# Patient Record
Sex: Male | Born: 1940 | Race: Black or African American | Hispanic: No | Marital: Single
Health system: Southern US, Community
[De-identification: ages and names within clinical notes are randomized; demographics above are authoritative.]

---

## 2014-07-25 ENCOUNTER — Other Ambulatory Visit: Payer: Self-pay | Admitting: Specialist

## 2014-07-25 DIAGNOSIS — M5416 Radiculopathy, lumbar region: Secondary | ICD-10-CM

## 2014-08-22 ENCOUNTER — Other Ambulatory Visit: Payer: Self-pay

## 2014-09-11 ENCOUNTER — Other Ambulatory Visit: Payer: Self-pay | Admitting: Specialist

## 2014-09-11 ENCOUNTER — Ambulatory Visit
Admission: RE | Admit: 2014-09-11 | Discharge: 2014-09-11 | Disposition: A | Discharge status: Home / Self Care | Payer: Medicare Other | Source: Ambulatory Visit | Attending: Specialist | Admitting: Specialist

## 2014-09-11 DIAGNOSIS — M5416 Radiculopathy, lumbar region: Secondary | ICD-10-CM

## 2014-09-11 DIAGNOSIS — T1590XA Foreign body on external eye, part unspecified, unspecified eye, initial encounter: Secondary | ICD-10-CM

## 2014-10-01 ENCOUNTER — Ambulatory Visit
Admission: RE | Admit: 2014-10-01 | Discharge: 2014-10-01 | Disposition: A | Discharge status: Home / Self Care | Payer: Medicare Other | Source: Ambulatory Visit | Attending: Specialist | Admitting: Specialist

## 2014-11-21 ENCOUNTER — Other Ambulatory Visit: Payer: Self-pay | Admitting: Neurosurgery

## 2014-11-21 DIAGNOSIS — M419 Scoliosis, unspecified: Secondary | ICD-10-CM

## 2014-12-26 ENCOUNTER — Other Ambulatory Visit: Payer: Medicare Other

## 2015-01-07 ENCOUNTER — Inpatient Hospital Stay
Admission: RE | Admit: 2015-01-07 | Discharge: 2015-01-07 | Disposition: A | Discharge status: Home / Self Care | Payer: Medicare Other | Source: Ambulatory Visit | Attending: Neurosurgery | Admitting: Neurosurgery

## 2015-01-07 ENCOUNTER — Inpatient Hospital Stay: Admission: RE | Admit: 2015-01-07 | Payer: Medicare Other | Source: Ambulatory Visit

## 2015-01-07 NOTE — Discharge Instructions (Signed)
Myelogram Discharge Instructions  1. Go home and rest quietly for the next 24 hours.  It is important to lie flat for the next 24 hours.  Get up only to go to the restroom.  You may lie in the bed or on a couch on your back, your stomach, your left side or your right side.  You may have one pillow under your head.  You may have pillows between your knees while you are on your side or under your knees while you are on your back.  2. DO NOT drive today.  Recline the seat as far back as it will go, while still wearing your seat belt, on the way home.  3. You may get up to go to the bathroom as needed.  You may sit up for 10 minutes to eat.  You may resume your normal diet and medications unless otherwise indicated.  Drink plenty of extra fluids today and tomorrow.  4. The incidence of a spinal headache with nausea and/or vomiting is about 5% (one in 20 patients).  If you develop a headache, lie flat and drink plenty of fluids until the headache goes away.  Caffeinated beverages may be helpful.  If you develop severe nausea and vomiting or a headache that does not go away with flat bed rest, call [phone].  5. You may resume normal activities after your 24 hours of bed rest is over; however, do not exert yourself strongly or do any heavy lifting tomorrow.  6. Call your physician for a follow-up appointment.   You may resume Xarelto today.  You may resume Bupropion, Effexor and Mirtazapine on Thursday, January 08, 2015 after 1:00p.m.

## 2015-01-21 ENCOUNTER — Other Ambulatory Visit: Payer: Self-pay | Admitting: Neurosurgery

## 2015-01-21 DIAGNOSIS — M419 Scoliosis, unspecified: Secondary | ICD-10-CM

## 2015-02-03 ENCOUNTER — Ambulatory Visit
Admission: RE | Admit: 2015-02-03 | Discharge: 2015-02-03 | Disposition: A | Discharge status: Home / Self Care | Payer: Medicare Other | Source: Ambulatory Visit | Attending: Neurosurgery | Admitting: Neurosurgery

## 2015-02-03 DIAGNOSIS — M419 Scoliosis, unspecified: Secondary | ICD-10-CM

## 2015-02-03 MED ORDER — IOHEXOL 180 MG/ML  SOLN
15.0000 mL | Freq: Once | INTRAMUSCULAR | Status: DC | PRN
  Administered 2015-02-03: 15 mL via INTRATHECAL

## 2015-02-03 MED ORDER — ONDANSETRON HCL 4 MG/2ML IJ SOLN
4.0000 mg | Freq: Once | INTRAMUSCULAR | Status: AC
  Administered 2015-02-03: 4 mg via INTRAMUSCULAR

## 2015-02-03 MED ORDER — MEPERIDINE HCL 100 MG/ML IJ SOLN
75.0000 mg | Freq: Once | INTRAMUSCULAR | Status: AC
  Administered 2015-02-03: 50 mg via INTRAMUSCULAR

## 2015-02-03 NOTE — Discharge Instructions (Signed)
Myelogram Discharge Instructions  1. Go home and rest quietly for the next 24 hours.  It is important to lie flat for the next 24 hours.  Get up only to go to the restroom.  You may lie in the bed or on a couch on your back, your stomach, your left side or your right side.  You may have one pillow under your head.  You may have pillows between your knees while you are on your side or under your knees while you are on your back.  2. DO NOT drive today.  Recline the seat as far back as it will go, while still wearing your seat belt, on the way home.  3. You may get up to go to the bathroom as needed.  You may sit up for 10 minutes to eat.  You may resume your normal diet and medications unless otherwise indicated.  Drink lots of extra fluids today and tomorrow.  4. The incidence of headache, nausea, or vomiting is about 5% (one in 20 patients).  If you develop a headache, lie flat and drink plenty of fluids until the headache goes away.  Caffeinated beverages may be helpful.  If you develop severe nausea and vomiting or a headache that does not go away with flat bed rest, call [phone].  5. You may resume normal activities after your 24 hours of bed rest is over; however, do not exert yourself strongly or do any heavy lifting tomorrow. If when you get up you have a headache when standing, go back to bed and force fluids for another 24 hours.  6. Call your physician for a follow-up appointment.  The results of your myelogram will be sent directly to your physician by the following day.  7. If you have any questions or if complications develop after you arrive home, please call [phone].  Discharge instructions have been explained to the patient.  The patient, or the person responsible for the patient, fully understands these instructions.       May resume Xeralto today.   May resume Mirtazapine, Bupropion and Effexor.

## 2015-02-03 NOTE — Progress Notes (Addendum)
Patient states he has been off Effexor, Bupropion and Mirtazapine for at least the past two days, and that he has been off Xarelto for the past 24 hours.

## 2016-01-03 IMAGING — MR MR LUMBAR SPINE W/O CM
5 series · 32 of 48 positions shown · non-contrast
Comparison: None.

CLINICAL DATA: Lumbar radiculopathy. Chronic lumbar spine pain.
Bilateral arm leg pain for over 20 years. Symptoms have recently
become worse.

EXAM:
MRI LUMBAR SPINE WITHOUT CONTRAST
TECHNIQUE: Multiplanar, multisequence MR imaging of the lumbar spine was
performed. No intravenous contrast was administered.

[Series 4: T2 · sagittal · 4.0mm · 0.49mm/px · 6 of 15 slices shown (1 of 2)]
[im 1/15]
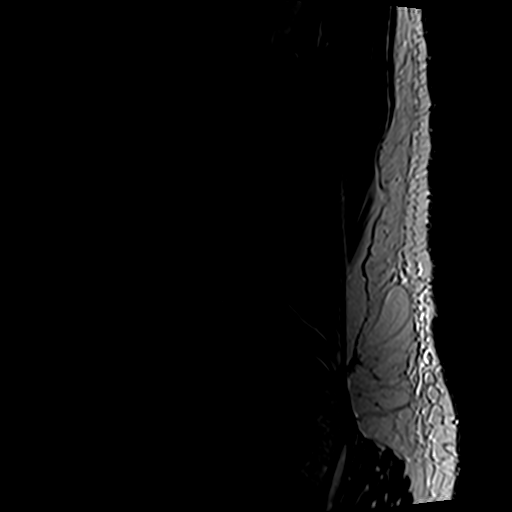
[im 3/15]
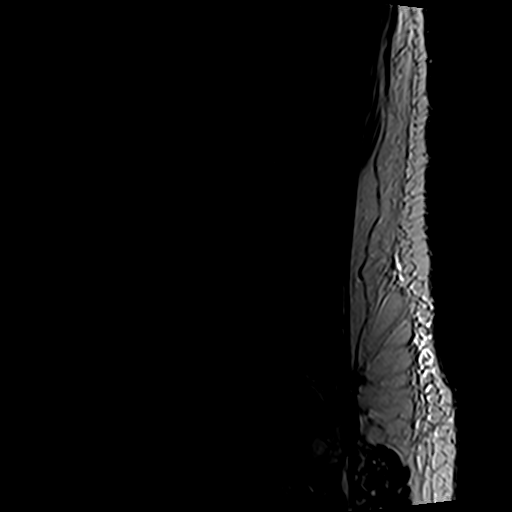
[im 6/15]
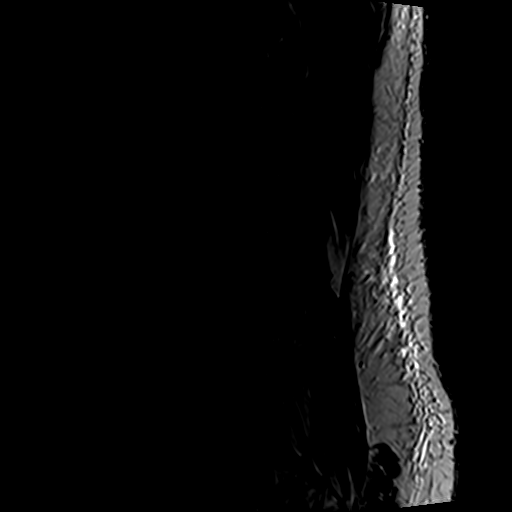
[im 9/15]
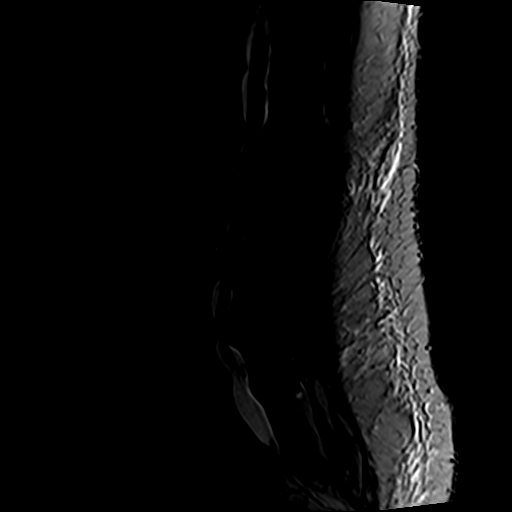
[im 12/15]
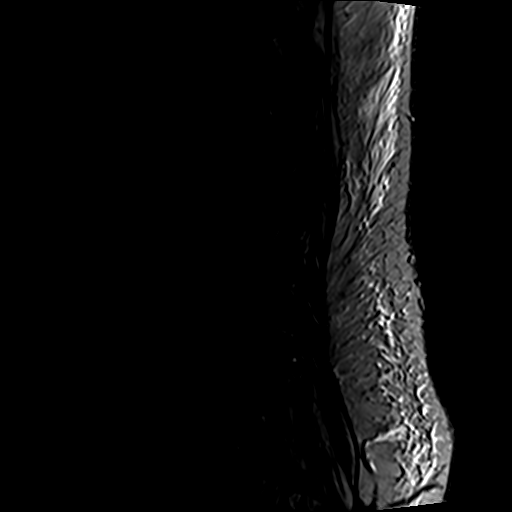
[im 15/15]
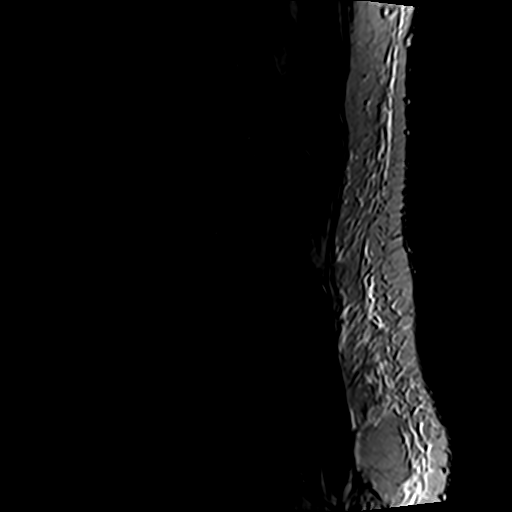

[Series 5: T1 · sagittal · 4.0mm · 0.49mm/px · 6 of 15 slices shown (1 of 2)]
[im 1/15]
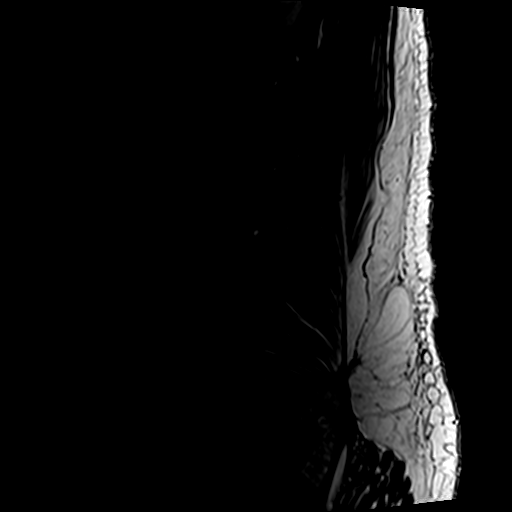
[im 3/15]
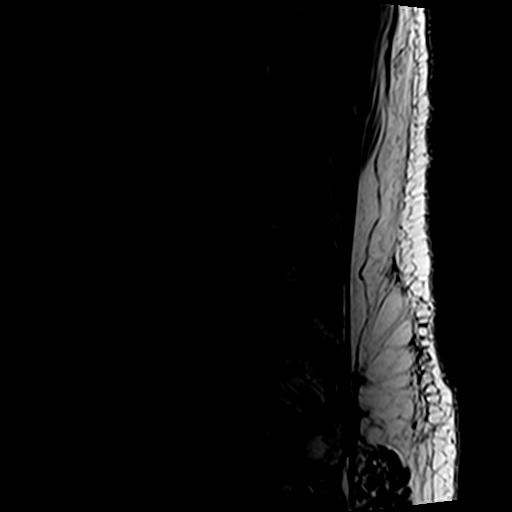
[im 6/15]
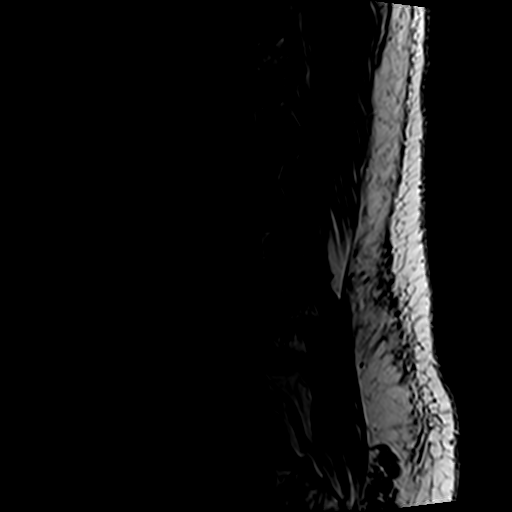
[im 9/15]
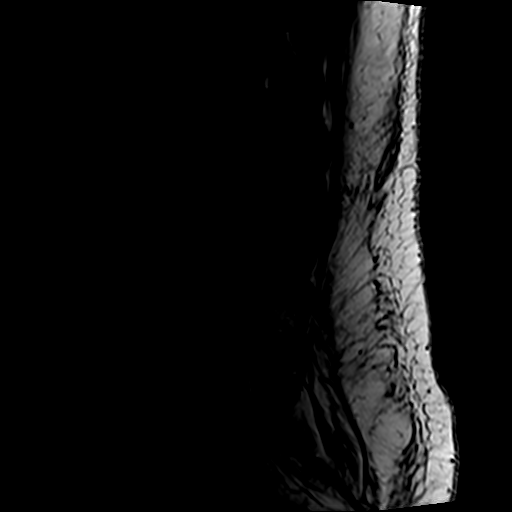
[im 12/15]
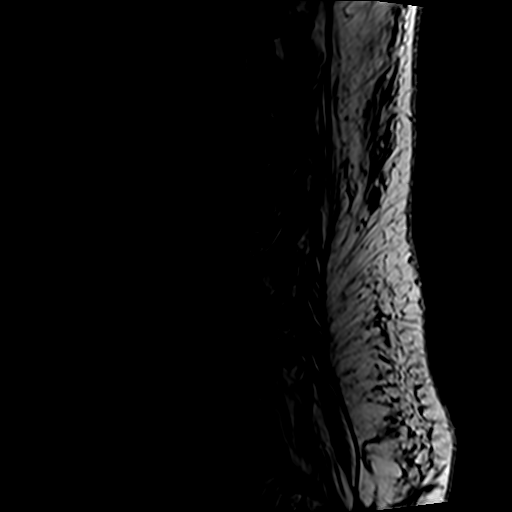
[im 15/15]
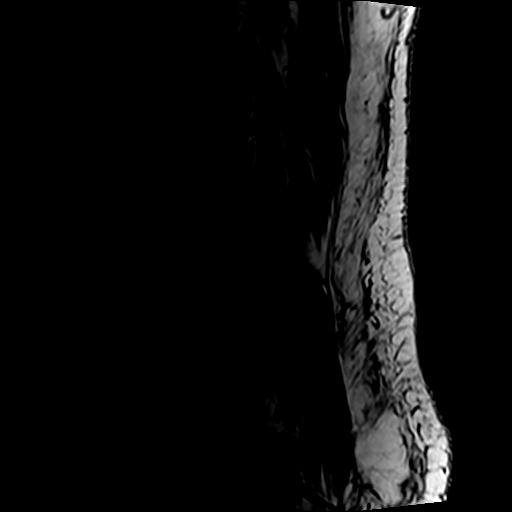

[Series 6: STIR · sagittal · 4.0mm · 0.49mm/px · 2 of 15 slices shown]
[im 1/15]
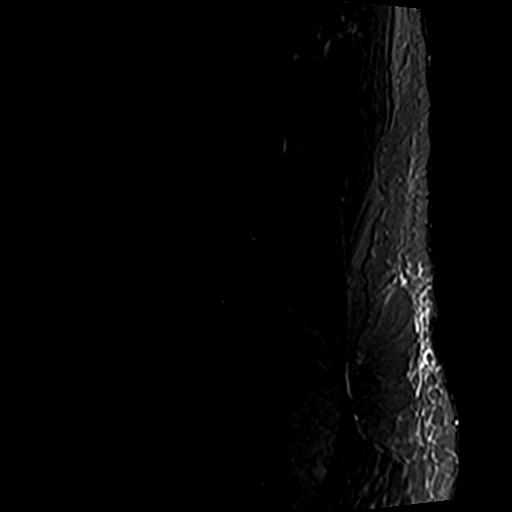
[im 3/15]
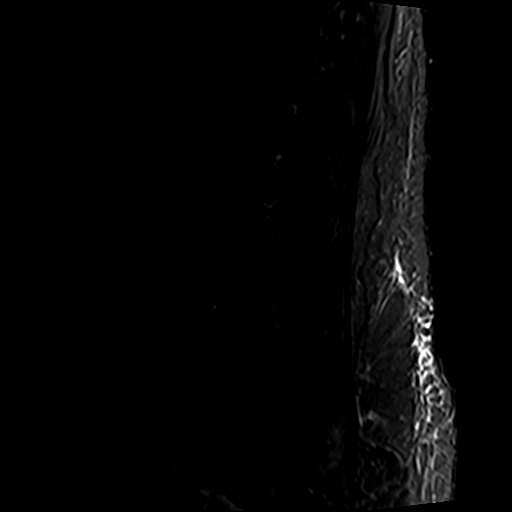

[Series 7: T2 · axial · 4.0mm · 0.74mm/px · z∈[-88,+115]mm · 9 of 37 slices shown (2 of 2)]
[im 1/37]
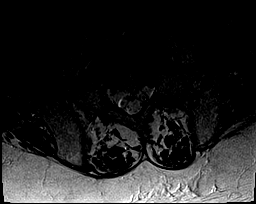
[im 6/37]
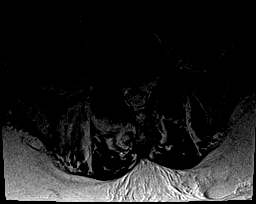
[im 11/37]
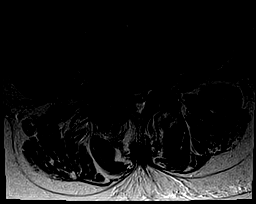
[im 16/37]
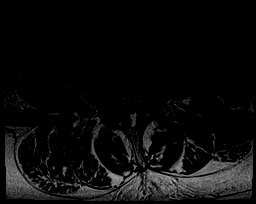
[im 19/37]
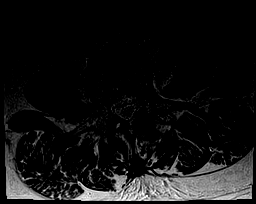
[im 21/37]
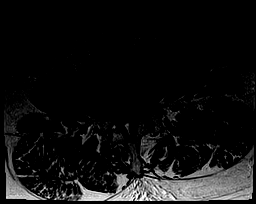
[im 26/37]
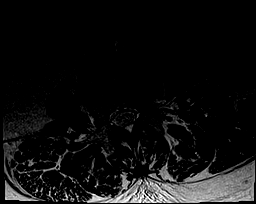
[im 31/37]
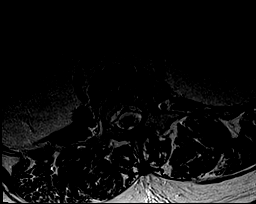
[im 37/37]
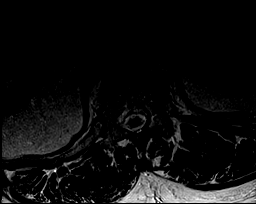

[Series 8: T1 · axial · 4.0mm · 0.74mm/px · z∈[-88,+115]mm · 9 of 37 slices shown (2 of 2)]
[im 1/37]
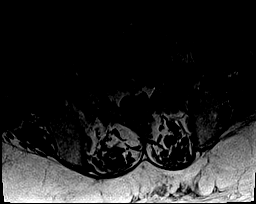
[im 6/37]
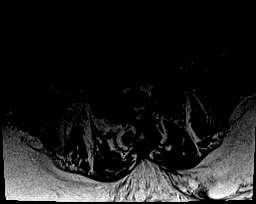
[im 11/37]
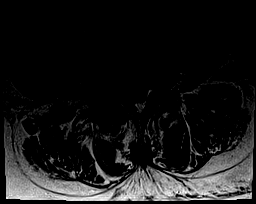
[im 16/37]
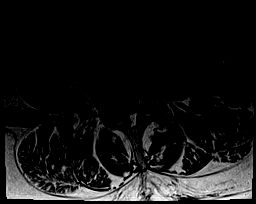
[im 19/37]
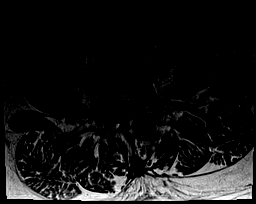
[im 21/37]
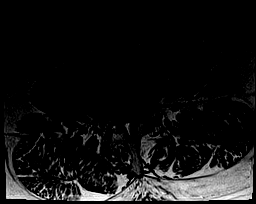
[im 26/37]
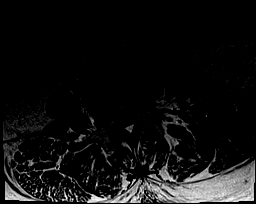
[im 31/37]
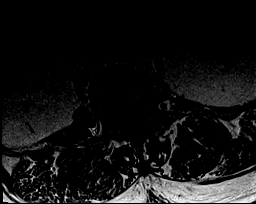
[im 37/37]
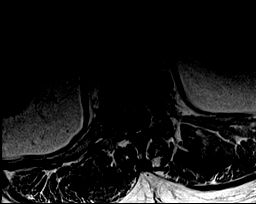

[32 of 48 positions shown; findings below may reference images not displayed]

FINDINGS: The conus medullaris terminates at L1-2. No abnormal signal is
present.

Rightward curvature of the lumbar spine is centered at L2-3.
Schmorl's nodes are present across each level T12-L1 through L3-4.
Right-sided endplate marrow changes are present at L4-5. Slight
degenerative retrolisthesis is present at L2-3 and L3-4.

Limited imaging of the abdomen is unremarkable.

T12-L1: Moderate facet hypertrophy is worse on the left. There is no
significant stenosis.

L1-2: A broad-based disc protrusion is present. Moderate facet
hypertrophy is evident. Mild left subarticular and foraminal
narrowing is present.

L2-3: A broad-based disc protrusion is present. Short pedicles and
facet hypertrophy contribute to severe central canal stenosis.
Moderate foraminal narrowing is evident bilaterally.

L3-4: A broad-based disc protrusion is present. Advanced facet
hypertrophy is worse on the right. Severe right and moderate left
subarticular stenosis is present. Severe foraminal stenosis is
present bilaterally.

L4-5: A broad-based disc protrusion is asymmetric to the right.
Advanced facet hypertrophy is worse on the right. Severe right and
moderate left subarticular and foraminal stenosis is present.

L5-S1: A right paramedian disc protrusion results in moderate right
subarticular stenosis. Moderate facet hypertrophy is present
bilaterally. Moderate foraminal stenosis is worse on the right.
IMPRESSION: 1. Multilevel acquired and congenital stenosis of the lumbar spine
as described.
2. Mild left subarticular and foraminal narrowing at L1-2.
3. Severe central canal stenosis at L2-3 with moderate foraminal
narrowing bilaterally.
4. Severe right and moderate left subarticular narrowing at L3-4.
5. Severe foraminal narrowing bilaterally at L3-4.
6. Severe right and moderate left subarticular and foraminal
narrowing at L4-5.
7. Moderate right subarticular and right greater than left foraminal
stenosis at L5-S1.
